# Patient Record
Sex: Male | Born: 1980 | Race: White | Hispanic: No | Marital: Married | Smoking: Former smoker
Health system: Southern US, Community
[De-identification: ages and names within clinical notes are randomized; demographics above are authoritative.]

---

## 2004-01-12 ENCOUNTER — Encounter
Admission: RE | Admit: 2004-01-12 | Discharge: 2004-04-11 | Payer: Self-pay | Admitting: Physical Medicine and Rehabilitation

## 2009-09-22 ENCOUNTER — Emergency Department (HOSPITAL_COMMUNITY): Admission: EM | Admit: 2009-09-22 | Discharge: 2009-09-22 | Payer: Self-pay | Admitting: Emergency Medicine

## 2010-07-26 IMAGING — CR DG HAND COMPLETE 3+V*L*
3 series · 3 of 3 positions shown · non-contrast
Comparison: None

CLINICAL DATA: Question infected cut knuckle.  The patient cut
third MCP joint region 1 week ago.  Pain and swelling over hand.

LEFT HAND - COMPLETE 3+ VIEW

[view not recorded (1 of 3)]
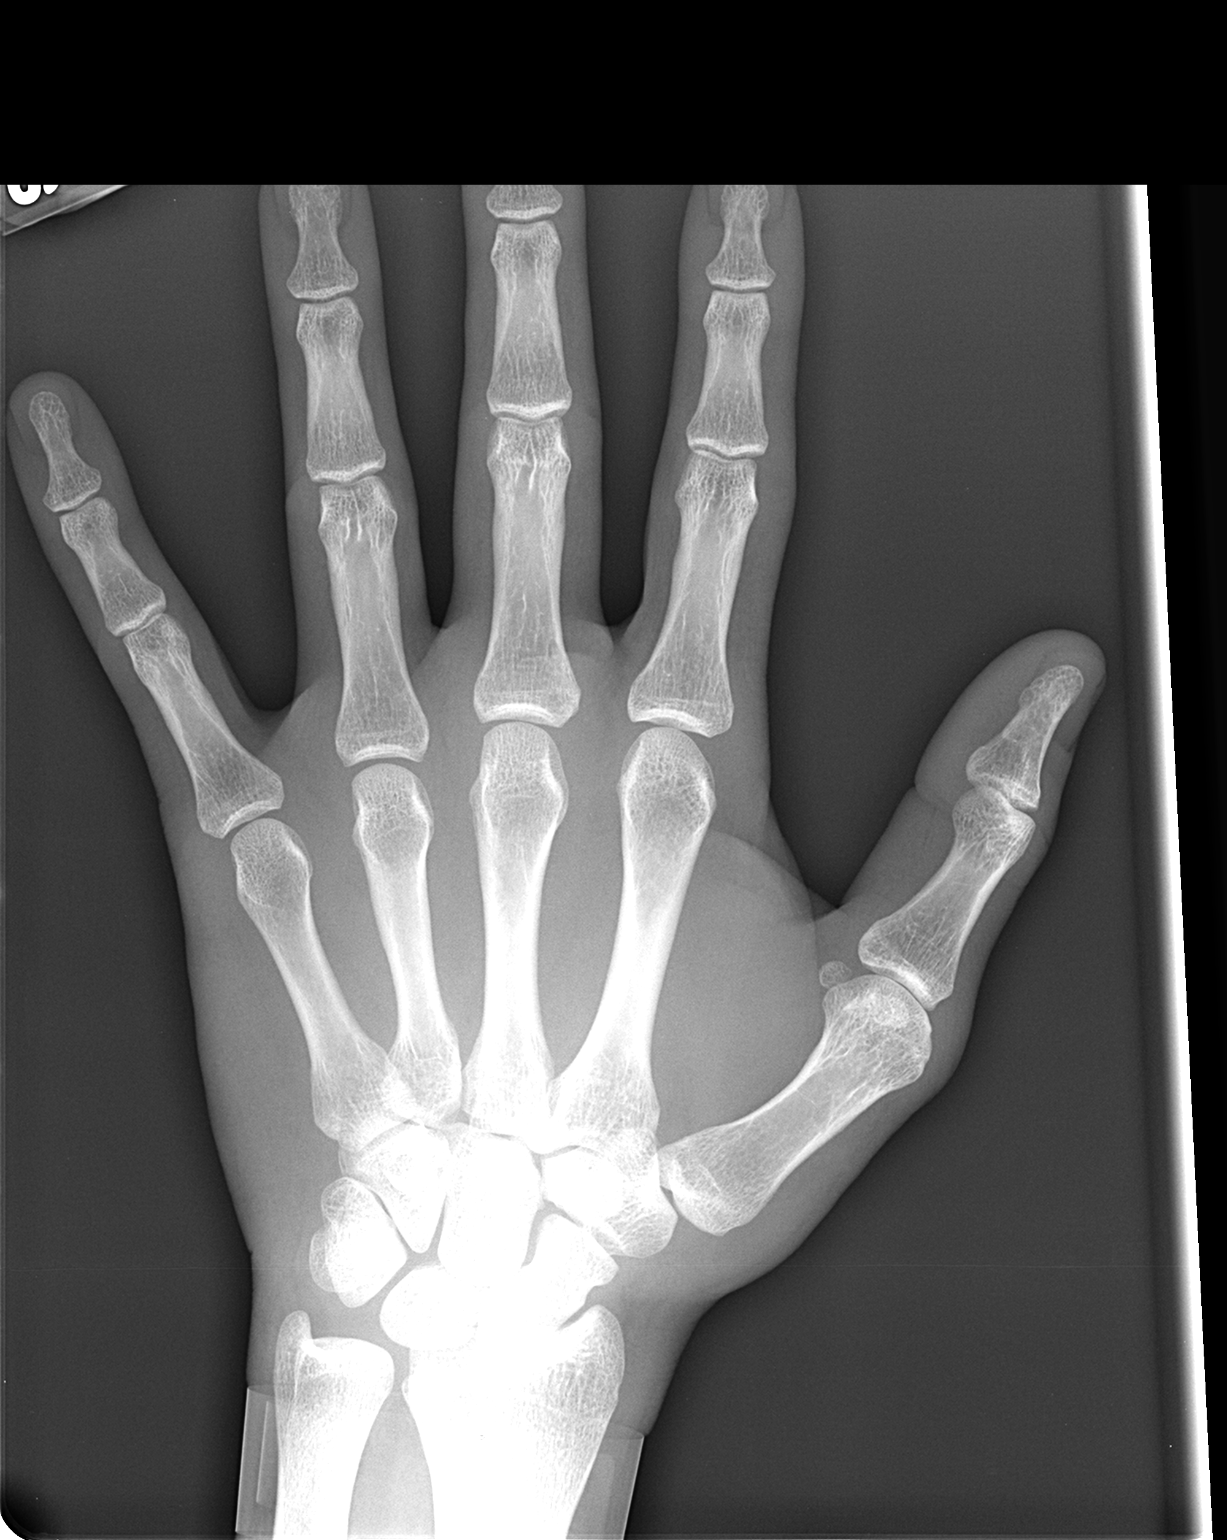

[view not recorded (2 of 3)]
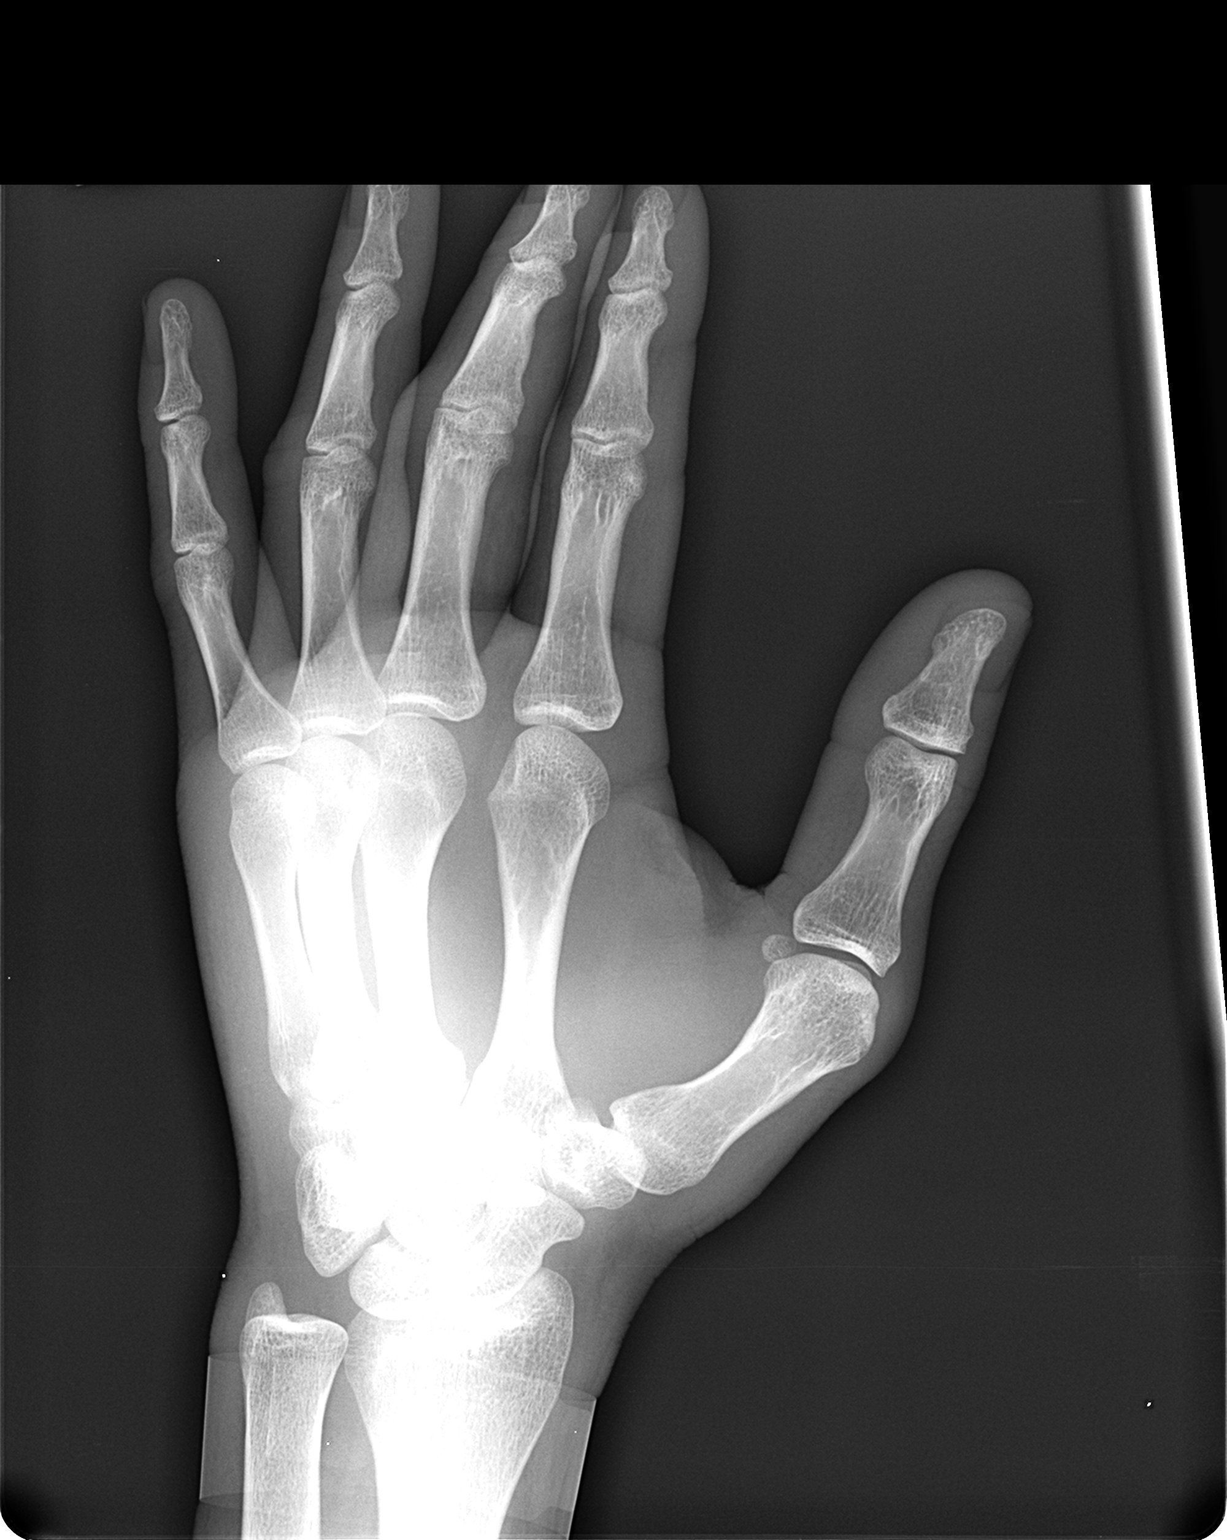

[view not recorded (3 of 3)]
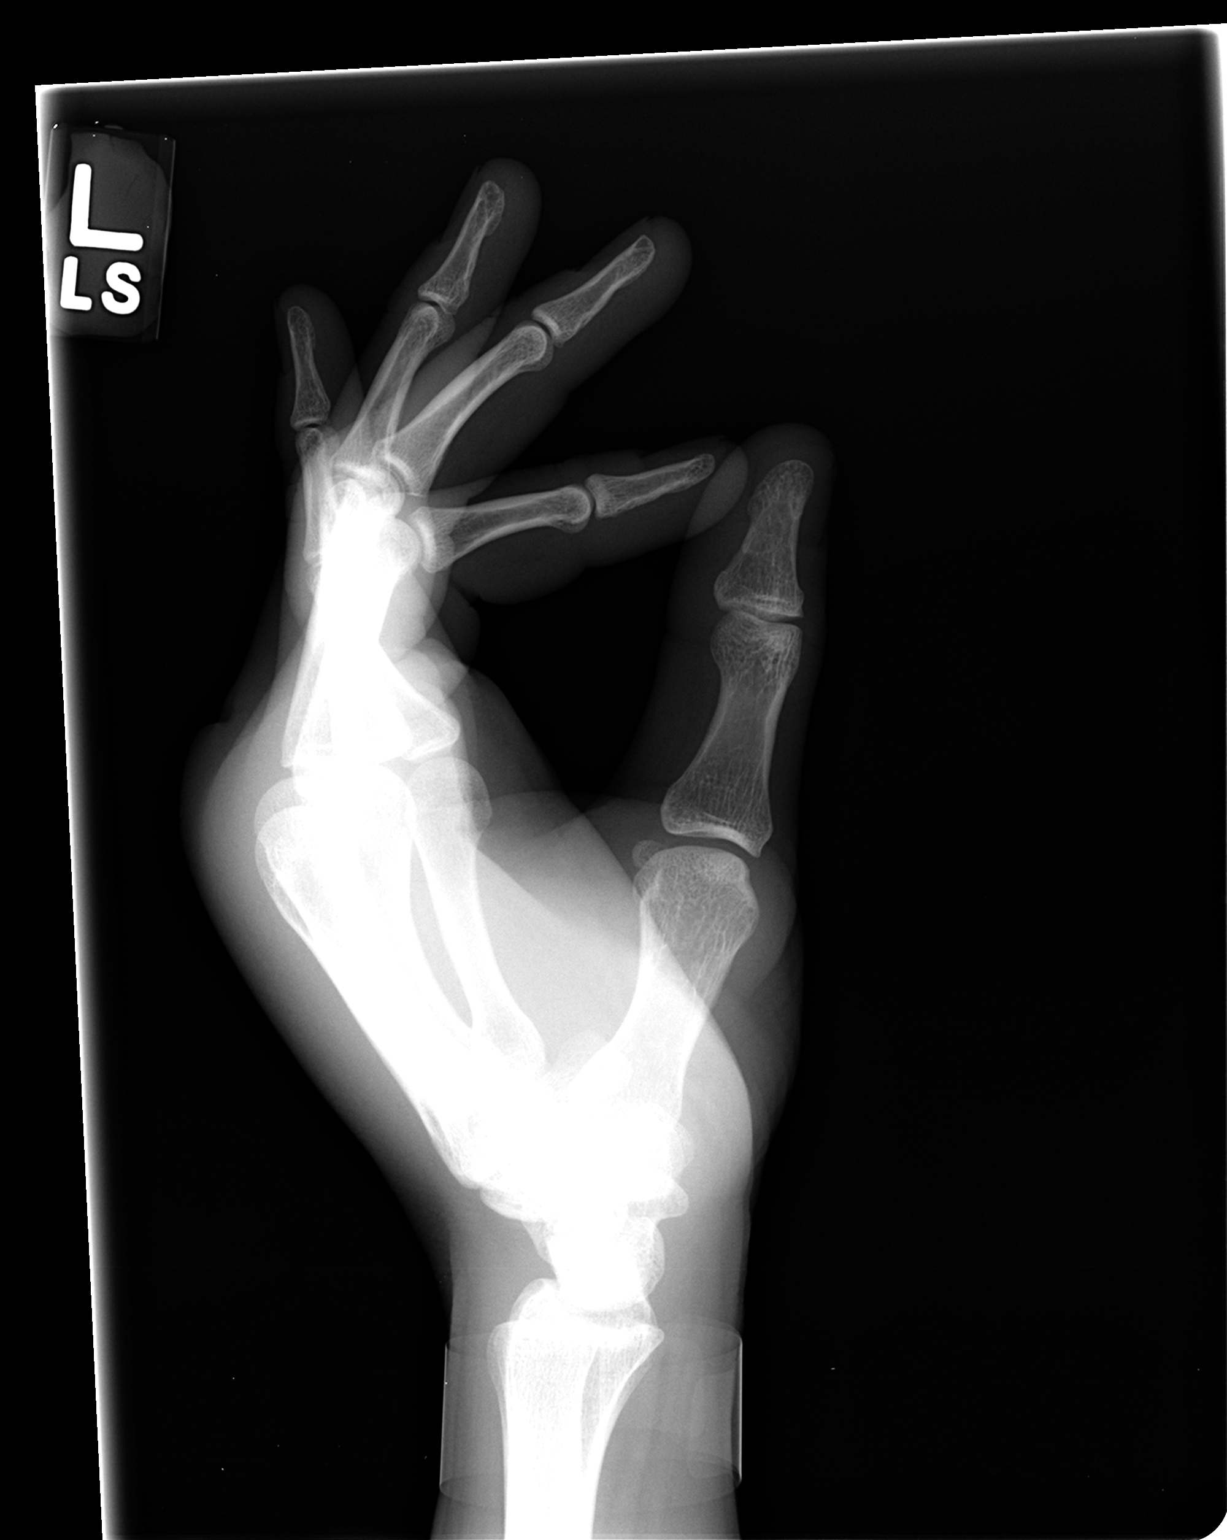

[3 of 3 positions shown; findings below may reference images not displayed]

FINDINGS: There is marked soft tissue swelling on the dorsal hand.
A focal divot in the skin surface is seen on the lateral view at
the level of the MCP joints and may reflect the patient's known
laceration.

The joints are normally aligned.  Bony mineralization appears
normal.  No acute or healing fracture is identified. No periosteal
reaction or bony erosion is identified.  No radiopaque foreign body
or soft tissue gas is seen.
IMPRESSION: 1. Soft tissue swelling of the hand.
2.  No radiographic evidence of osteomyelitis or fracture.

## 2012-09-01 ENCOUNTER — Emergency Department (HOSPITAL_COMMUNITY): Payer: Self-pay

## 2012-09-01 ENCOUNTER — Other Ambulatory Visit: Payer: Self-pay

## 2012-09-01 ENCOUNTER — Encounter (HOSPITAL_COMMUNITY): Payer: Self-pay | Admitting: Emergency Medicine

## 2012-09-01 ENCOUNTER — Emergency Department (HOSPITAL_COMMUNITY)
Admission: EM | Admit: 2012-09-01 | Discharge: 2012-09-01 | Disposition: A | Discharge status: Home / Self Care | Payer: Self-pay | Attending: Emergency Medicine | Admitting: Emergency Medicine

## 2012-09-01 DIAGNOSIS — M25519 Pain in unspecified shoulder: Secondary | ICD-10-CM | POA: Insufficient documentation

## 2012-09-01 DIAGNOSIS — M25512 Pain in left shoulder: Secondary | ICD-10-CM

## 2012-09-01 MED ORDER — OXYCODONE-ACETAMINOPHEN 5-325 MG PO TABS
2.0000 | ORAL_TABLET | Freq: Once | ORAL | Status: AC
  Administered 2012-09-01: 2 via ORAL
  Filled 2012-09-01: qty 2

## 2012-09-01 MED ORDER — OXYCODONE-ACETAMINOPHEN 5-325 MG PO TABS: 1.0000 | ORAL_TABLET | ORAL | Status: DC | PRN

## 2012-09-01 MED ORDER — DIAZEPAM 5 MG PO TABS
5.0000 mg | ORAL_TABLET | Freq: Once | ORAL | Status: AC
  Administered 2012-09-01: 5 mg via ORAL
  Filled 2012-09-01: qty 1

## 2012-09-01 NOTE — ED Provider Notes (Signed)
History     CSN: [id number]  Arrival date & time 09/01/12  5621   First MD Initiated Contact with Patient 09/01/12 [phone]      Chief Complaint  Patient presents with  . Shoulder Pain     Patient is a 31 y.o. male presenting with shoulder pain. The history is provided by the patient.  Shoulder Pain This is a new problem. The current episode started more than 1 week ago. The problem occurs constantly. The problem has been gradually worsening. Pertinent negatives include no chest pain and no shortness of breath. Nothing aggravates the symptoms. Nothing relieves the symptoms. He has tried rest (pain meds) for the symptoms. The treatment provided no relief.  pt reports left shoulder/scapular pain for the past 2 weeks.  He denies injury, he denies heavy lifting.  He reports he has some "inflammation disorder" but is unsure exactly what it is called.  He reports the pain has been worsening and he has tried home pain meds as well as massage but no relief No anterior chest pain.  He reports pain is mostly in left scapular with movement into left anterior shoulder No fever/vomiting.  No SOB.  No focal weakness of his arms.    PMH - none  History reviewed. No pertinent past surgical history.  History reviewed. No pertinent family history.  History  Substance Use Topics  . Smoking status: Never Smoker   . Smokeless tobacco: Not on file  . Alcohol Use: Yes     occasional      Review of Systems  Constitutional: Negative for fever.  Respiratory: Negative for shortness of breath.   Cardiovascular: Negative for chest pain.  Neurological: Negative for weakness.  All other systems reviewed and are negative.    Allergies  Review of patient's allergies indicates no known allergies.  Home Medications  No current outpatient prescriptions on file.  BP 117/87  Pulse 77  Temp 97.7 F (36.5 C) (Oral)  Resp 20  Ht 5\' 11"  (1.803 m)  Wt 190 lb (86.183 kg)  BMI 26.50 kg/m2  SpO2  97%  Physical Exam CONSTITUTIONAL: Well developed/well nourished, anxious HEAD AND FACE: Normocephalic/atraumatic EYES: EOMI/PERRL ENMT: Mucous membranes moist NECK: supple no meningeal signs SPINE:entire spine nontender CV: S1/S2 noted, no murmurs/rubs/gallops noted LUNGS: Lungs are clear to auscultation bilaterally, no apparent distress ABDOMEN: soft, nontender, no rebound or guarding GU:no cva tenderness NEURO: Pt is awake/alert, moves all extremitiesx4.  Equal hand grips, and equal power with ROM of each shoulder EXTREMITIES: pulses normal, full ROM.  Tenderness to left anterior shoulder and tenderness to left scapula.  No bruising/erythema/warmth noted.  No edema noted to either upper extremity.  He can range the left shoulder.  No deformity noted SKIN: warm, color normal PSYCH: anxious  ED Course  Procedures  3:58 AM Pt with atraumatic left shoulder/scapular pain for at least two weeks with worsening tonight.  He is very anxious.  EKG ordered but he denies anterior CP and denies SOB.  Will obtain CXR to include left shoulder and then reassess  4:42 AM Pt requesting more meds for pain control.  Also requesting left shoulder sling for comfort   Pt improved We discussed xray/ekg results He reports he has had other joint pain previously that will respond to NSAIDs and this pain is similar to previous joint pain He has point tenderness to left scapula but no skin changes No bruising noted Denies anterior CP, no pleuritic type CP.  No weakness in his arms  reported.   Stable for d/c MDM  Nursing notes including past medical history and social history reviewed and considered in documentation xrays reviewed and considered    Date: 09/01/2012  Rate: 86  Rhythm: normal sinus rhythm  QRS Axis: right  Intervals: normal  ST/T Wave abnormalities: normal  Conduction Disutrbances:none  Narrative Interpretation:   Old EKG Reviewed: none available at time of  interpretation       Joya Gaskins, MD 09/01/12 [phone]

## 2012-09-01 NOTE — ED Notes (Addendum)
Pt states L shoulder pain x2 weeks, states that the pain has been increasing and he was unable to sleep tonight. Pt states the pain radiates across both of his shoulders. Denies any injury. States he has some type of inflammation disoder in the past but does not know what it was called.

## 2013-07-05 IMAGING — CR DG CHEST 2V
2 series · 2 of 2 positions shown · non-contrast
Comparison: None.

CLINICAL DATA: Left shoulder pain.

CHEST - 2 VIEW

[view not recorded (1 of 2)]
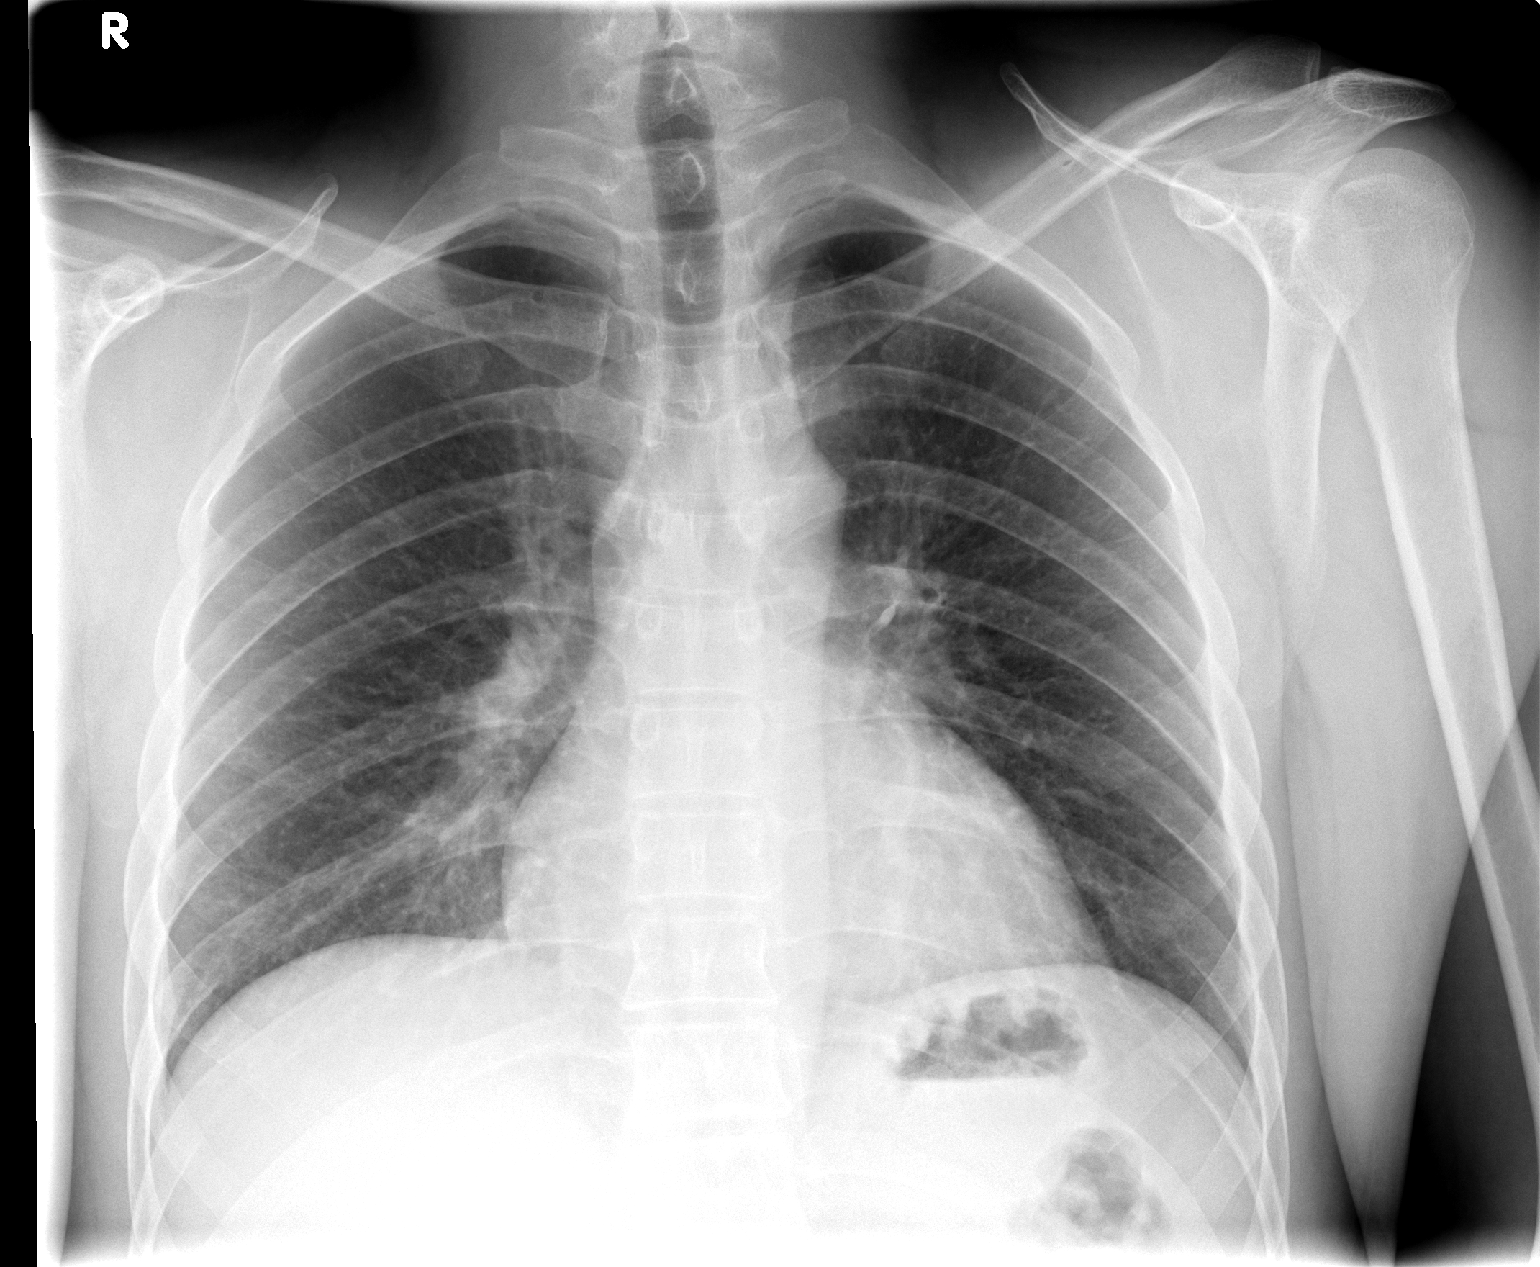

[view not recorded (2 of 2)]
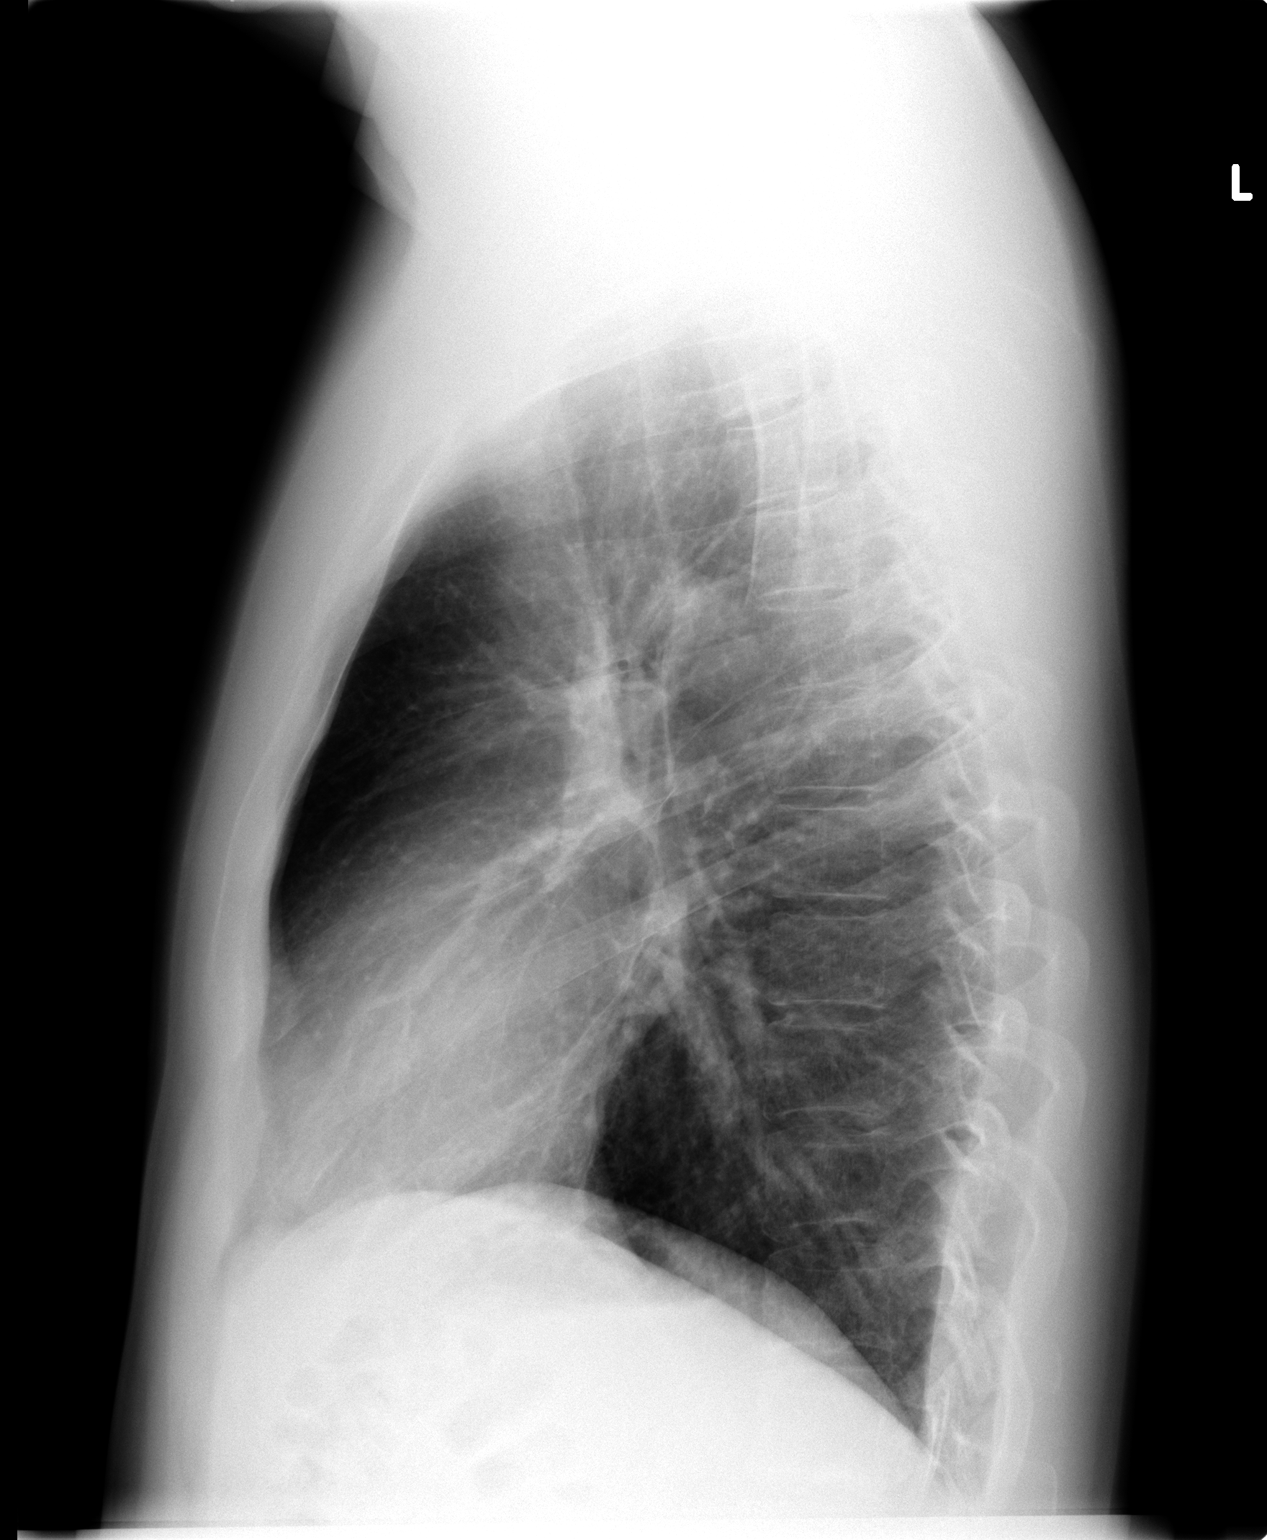

[2 of 2 positions shown; findings below may reference images not displayed]

FINDINGS: Two views of the chest were obtained. Heart and
mediastinum are within normal limits.  The trachea is midline.
Bony structures are intact.
IMPRESSION: No acute cardiopulmonary disease.

## 2014-10-27 ENCOUNTER — Telehealth: Payer: Self-pay | Admitting: Family Medicine

## 2014-10-27 NOTE — Telephone Encounter (Signed)
Pt given appt for 1/15 with dr Hyacinth Meekermiller @ 9:30.

## 2014-12-19 ENCOUNTER — Encounter: Payer: Self-pay | Admitting: Family Medicine

## 2014-12-19 ENCOUNTER — Ambulatory Visit (INDEPENDENT_AMBULATORY_CARE_PROVIDER_SITE_OTHER): Payer: Self-pay | Admitting: Family Medicine

## 2014-12-19 VITALS — BP 124/70 | HR 77 | Temp 96.4°F | Ht 71.0 in | Wt 174.0 lb

## 2014-12-19 DIAGNOSIS — M5489 Other dorsalgia: Secondary | ICD-10-CM

## 2014-12-19 MED ORDER — PREDNISONE 20 MG PO TABS: ORAL_TABLET | ORAL | Status: AC

## 2014-12-19 NOTE — Progress Notes (Signed)
   Subjective:    Patient ID: Craig Moran, male    DOB: 10/15/1981, 34 y.o.   MRN: [id number]  HPI  Patient comes in today with complaint of left scapula pain x 2 months. He denies any injury and is taking Aleve for the discomfort.   Review of Systems     Objective:   Physical Exam BP 124/70 mmHg  Pulse 77  Temp(Src) 96.4 F (35.8 C) (Oral)  Ht 5\' 11"  (1.803 m)  Wt 174 lb (78.926 kg)  BMI 24.28 kg/m2        Assessment & Plan:

## 2014-12-19 NOTE — Progress Notes (Signed)
   Subjective:    Patient ID: Craig MullerSamuel Moran, male    DOB: 09-28-1981, 34 y.o.   MRN: [id number]  HPI 34 year old gentleman with pain in his left inner scapula. This is been chronic lasting 10 years by his history. There is some pain in addition to the triceps area of the left arm and in the fourth and fifth fingers. I think his symptoms are consistent with a C78 radiculopathy but he does not have any neck pain. He can relieve the pain by extreme abduction of the left arm. He has been taking Aleve without much relief    Review of Systems  Constitutional: Negative.   HENT: Negative.   Eyes: Negative.   Respiratory: Negative.  Negative for shortness of breath.   Cardiovascular: Negative.  Negative for chest pain and leg swelling.  Gastrointestinal: Negative.   Genitourinary: Negative.   Musculoskeletal: Positive for back pain.  Skin: Negative.   Neurological: Negative.   Psychiatric/Behavioral: Negative.   All other systems reviewed and are negative.      Objective:   Physical Exam  Musculoskeletal:  Tenderness along the inner aspect of the left scapula as well as at the olecranon left arm. Reflexes are okay. And there is no neck tenderness   BP 124/70 mmHg  Temp(Src) 96.4 F (35.8 C) (Oral)  Resp 77  Ht 5\' 11"  (1.803 m)  Wt 174 lb (78.926 kg)  BMI 24.28 kg/m2       Assessment & Plan:  1. 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 Discussed various options of treatment such as trigger point injection physical therapy or further testing with MRI. Since he does not have insurance now he opts for the least expensive way to treat it so I'll give him a taper dose of prednisone beginning 60 mg and taper over 10 days ask him to hold Aleve. Also suggested massaging the area by rolling over a tennis ball  Frederica KusterStephen M Miller MD

## 2022-11-21 DIAGNOSIS — Z0000 Encounter for general adult medical examination without abnormal findings: Secondary | ICD-10-CM | POA: Diagnosis not present

## 2022-11-21 DIAGNOSIS — Z13220 Encounter for screening for lipoid disorders: Secondary | ICD-10-CM | POA: Diagnosis not present

## 2022-11-21 DIAGNOSIS — J0140 Acute pansinusitis, unspecified: Secondary | ICD-10-CM | POA: Diagnosis not present

## 2022-12-07 DIAGNOSIS — K2100 Gastro-esophageal reflux disease with esophagitis, without bleeding: Secondary | ICD-10-CM | POA: Diagnosis not present

## 2022-12-28 DIAGNOSIS — K219 Gastro-esophageal reflux disease without esophagitis: Secondary | ICD-10-CM | POA: Diagnosis not present

## 2022-12-28 DIAGNOSIS — R1013 Epigastric pain: Secondary | ICD-10-CM | POA: Diagnosis not present

## 2023-03-28 DIAGNOSIS — J0140 Acute pansinusitis, unspecified: Secondary | ICD-10-CM | POA: Diagnosis not present

## 2023-06-12 DIAGNOSIS — M62830 Muscle spasm of back: Secondary | ICD-10-CM | POA: Diagnosis not present

## 2023-08-29 DIAGNOSIS — R070 Pain in throat: Secondary | ICD-10-CM | POA: Diagnosis not present

## 2023-08-29 DIAGNOSIS — J020 Streptococcal pharyngitis: Secondary | ICD-10-CM | POA: Diagnosis not present

## 2024-02-08 DIAGNOSIS — R070 Pain in throat: Secondary | ICD-10-CM | POA: Diagnosis not present

## 2024-02-08 DIAGNOSIS — J0140 Acute pansinusitis, unspecified: Secondary | ICD-10-CM | POA: Diagnosis not present

## 2024-04-11 DIAGNOSIS — T753XXA Motion sickness, initial encounter: Secondary | ICD-10-CM | POA: Diagnosis not present

## 2024-06-28 DIAGNOSIS — Z3009 Encounter for other general counseling and advice on contraception: Secondary | ICD-10-CM | POA: Diagnosis not present

## 2024-08-09 DIAGNOSIS — Z302 Encounter for sterilization: Secondary | ICD-10-CM | POA: Diagnosis not present
